# Patient Record
Sex: Male | Born: 1995 | Race: Black or African American | Hispanic: No | Marital: Single | Smoking: Never smoker
Health system: Southern US, Community
[De-identification: ages and names within clinical notes are randomized; demographics above are authoritative.]

## PROBLEM LIST (undated history)

## (undated) HISTORY — PX: ABDOMINAL SURGERY: SHX537

---

## 2020-05-25 ENCOUNTER — Emergency Department (HOSPITAL_COMMUNITY)
Admission: EM | Admit: 2020-05-25 | Discharge: 2020-05-25 | Disposition: A | Discharge status: Home / Self Care | Payer: Worker's Compensation | Attending: Emergency Medicine | Admitting: Emergency Medicine

## 2020-05-25 ENCOUNTER — Emergency Department (HOSPITAL_COMMUNITY): Payer: Worker's Compensation

## 2020-05-25 ENCOUNTER — Other Ambulatory Visit: Payer: Self-pay

## 2020-05-25 ENCOUNTER — Encounter (HOSPITAL_COMMUNITY): Payer: Self-pay | Admitting: Emergency Medicine

## 2020-05-25 DIAGNOSIS — Y990 Civilian activity done for income or pay: Secondary | ICD-10-CM | POA: Diagnosis not present

## 2020-05-25 DIAGNOSIS — W260XXA Contact with knife, initial encounter: Secondary | ICD-10-CM | POA: Insufficient documentation

## 2020-05-25 DIAGNOSIS — S61012A Laceration without foreign body of left thumb without damage to nail, initial encounter: Secondary | ICD-10-CM

## 2020-05-25 NOTE — Discharge Instructions (Addendum)
Keep area clean by washing with soap and water daily.  Apply a bandage at least once daily, change more often if it is dirty Watch for signs of infection (redness, drainage, worsening pain) and return to the ER if you have concerns for this  Take Tylenol or Ibuprofen for pain as needed

## 2020-05-25 NOTE — ED Provider Notes (Signed)
[facility]-EMERGENCY DEPT Provider Note   CSN: [id number] Arrival date & time: 05/25/20  1943     History Chief Complaint  Patient presents with  . Laceration    Matthew Dixon is a 24 y.o. male.  HPI 24 year old male with no significant medical history presents to the ER with a laceration to his left thumb.  States he cut his finger with a knife while at work.  States that he had quite a bit of bleeding but by the time he had arrived to the ER bleeding was controlled.  Denies any numbness or tingling.  Unaware of last tetanus shot.    History reviewed. No pertinent past medical history.  There are no problems to display for this patient.   Past Surgical History:  Procedure Laterality Date  . ABDOMINAL SURGERY         History reviewed. No pertinent family history.  Social History   Tobacco Use  . Smoking status: Never Smoker  . Smokeless tobacco: Never Used  Substance Use Topics  . Alcohol use: Not on file  . Drug use: Not on file    Home Medications Prior to Admission medications   Not on File    Allergies    Patient has no known allergies.  Review of Systems   Review of Systems  Skin: Positive for color change and wound.  Neurological: Negative for weakness and numbness.    Physical Exam Updated Vital Signs BP (!) 141/84 (BP Location: Right Arm)   Pulse 69   Temp 98 F (36.7 C) (Oral)   Resp 14   Ht 5\' 10"  (1.778 m)   Wt 86.2 kg   SpO2 100%   BMI 27.26 kg/m   Physical Exam Vitals reviewed.  Constitutional:      General: He is not in acute distress.    Appearance: Normal appearance. He is not ill-appearing, toxic-appearing or diaphoretic.  HENT:     Head: Normocephalic and atraumatic.  Eyes:     General:        Right eye: No discharge.        Left eye: No discharge.     Extraocular Movements: Extraocular movements intact.     Conjunctiva/sclera: Conjunctivae normal.  Musculoskeletal:        General: No  swelling. Normal range of motion.  Skin:    Capillary Refill: Capillary refill takes less than 2 seconds.     Coloration: Skin is not jaundiced.     Findings: Erythema and lesion present. No bruising.     Comments: Small superficial laceration to the lateral aspect of the left thumb near the fingernail bed.  However no visible nailbed injury noted.  Bleeding controlled.<2 cap refill.  Good sensations and strength intact.  Full range of motion of DIP and PIP joint.  No visible foreign bodies or tendons.  Neurological:     General: No focal deficit present.     Mental Status: He is alert and oriented to person, place, and time.     Sensory: No sensory deficit.     Motor: No weakness.  Psychiatric:        Mood and Affect: Mood normal.        Behavior: Behavior normal.     ED Results / Procedures / Treatments   Labs (all labs ordered are listed, but only abnormal results are displayed) Labs Reviewed - No data to display  EKG None  Radiology No results found.  Procedures Procedures (including critical care  time)  Medications Ordered in ED Medications - No data to display  ED Course  I have reviewed the triage vital signs and the nursing notes.  Pertinent labs & imaging results that were available during my care of the patient were reviewed by me and considered in my medical decision making (see chart for details).    MDM Rules/Calculators/A&P                         24 year old male with superficial laceration to the left thumb.  Bleeding is controlled.  Laceration not deep enough for suture repair.  No visible nailbed damage.  Good cap refill, strength and sensation intact.  Plain films not evidence of foreign bodies or fractures.  I offered the patient a tetanus shot, however he refused this.  I did explain the risks of not updating the tetanus and he voiced understanding of the risks.  He denies the shot.  Educated on proper wound care.  Discussed return precautions which  included worsening pain, redness, swelling, fevers, decreased range of motion etc.  He voiced understanding and is agreeable.  At this stage in the ED course, the patient is medically screened and stable for discharge. Final Clinical Impression(s) / ED Diagnoses Final diagnoses:  Laceration of left thumb without foreign body without damage to nail, initial encounter    Rx / DC Orders ED Discharge Orders    None       Leone Brand 05/25/20 2226    Sabas Sous, MD 05/25/20 [phone]

## 2020-05-25 NOTE — ED Triage Notes (Signed)
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 He has managed to control the bleeding.

## 2021-04-30 IMAGING — CR DG FINGER THUMB 2+V*L*
3 series · 3 of 3 positions shown · non-contrast
Comparison: None.

CLINICAL DATA: Laceration

EXAM:
LEFT THUMB 2+V

[x finger pa left]
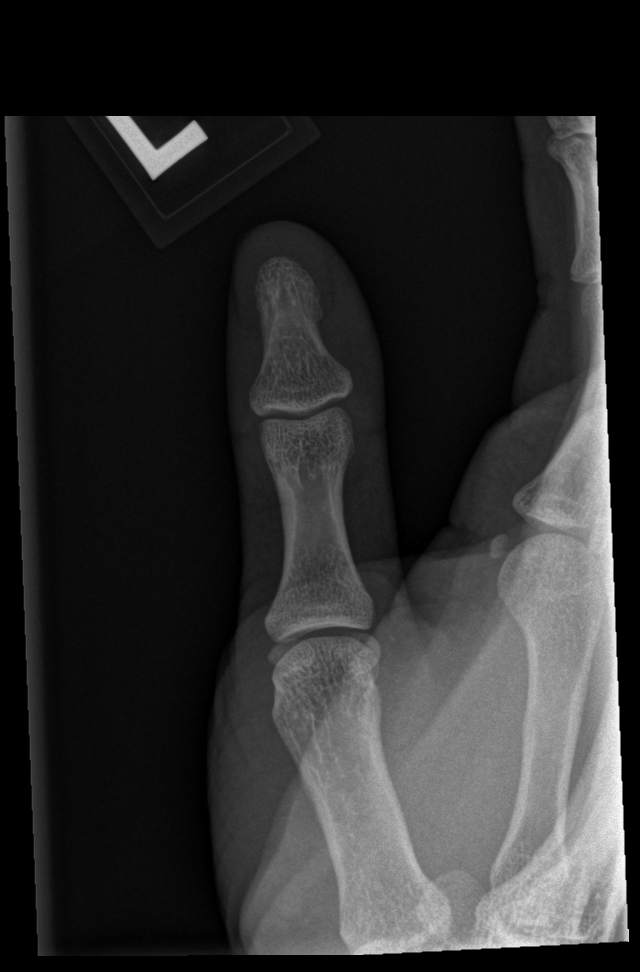

[x finger obl left]
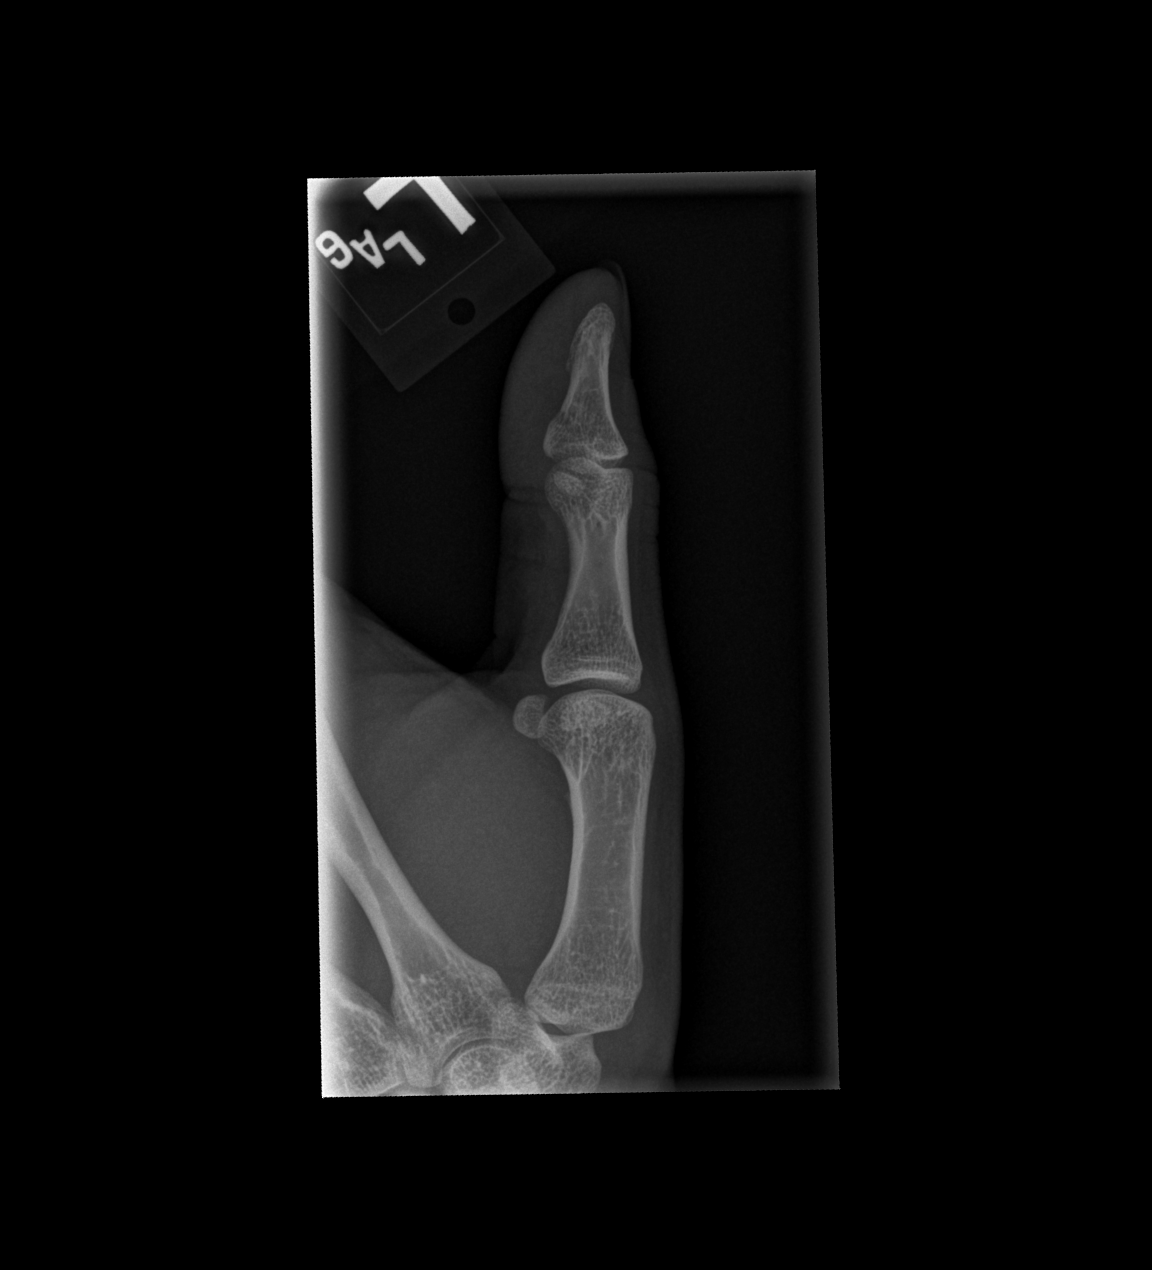

[x finger lat left]
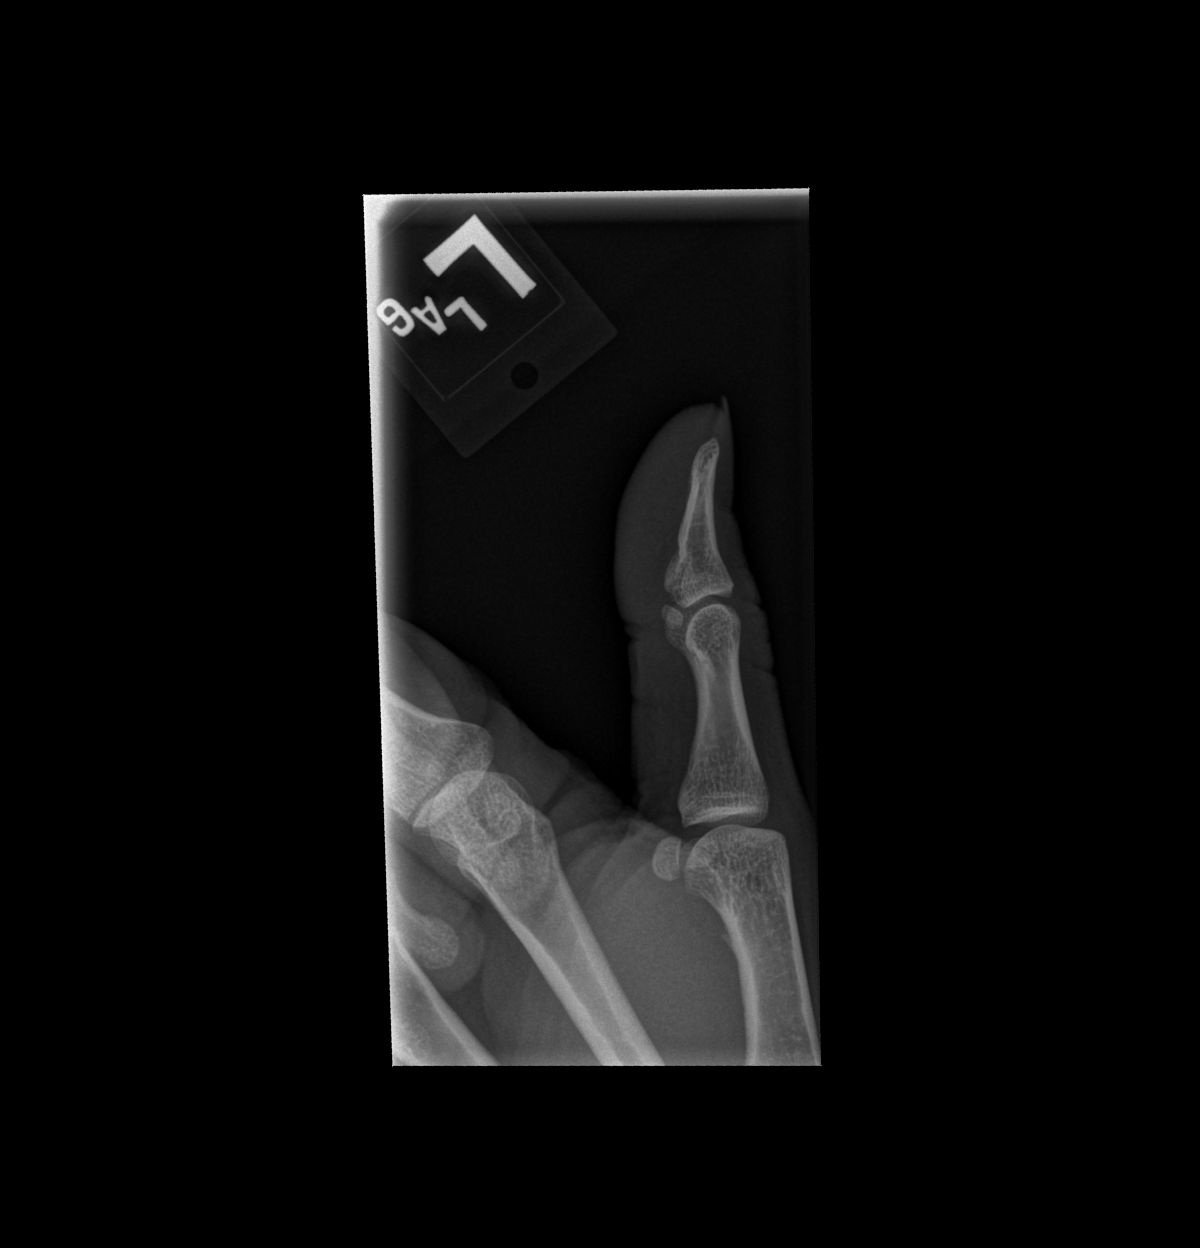

[3 of 3 positions shown; findings below may reference images not displayed]

FINDINGS: There is no evidence of fracture or dislocation. There is no
evidence of arthropathy or other focal bone abnormality. Soft
tissues are unremarkable.
IMPRESSION: Negative.

## 2024-05-19 ENCOUNTER — Emergency Department (HOSPITAL_COMMUNITY)
Admission: EM | Admit: 2024-05-19 | Discharge: 2024-05-19 | Payer: Self-pay | Attending: Emergency Medicine | Admitting: Emergency Medicine

## 2024-05-19 ENCOUNTER — Encounter (HOSPITAL_COMMUNITY): Payer: Self-pay | Admitting: Emergency Medicine

## 2024-05-19 ENCOUNTER — Other Ambulatory Visit: Payer: Self-pay

## 2024-05-19 DIAGNOSIS — Y9339 Activity, other involving climbing, rappelling and jumping off: Secondary | ICD-10-CM | POA: Insufficient documentation

## 2024-05-19 DIAGNOSIS — Z23 Encounter for immunization: Secondary | ICD-10-CM | POA: Insufficient documentation

## 2024-05-19 DIAGNOSIS — S31119A Laceration without foreign body of abdominal wall, unspecified quadrant without penetration into peritoneal cavity, initial encounter: Secondary | ICD-10-CM | POA: Insufficient documentation

## 2024-05-19 DIAGNOSIS — W25XXXA Contact with sharp glass, initial encounter: Secondary | ICD-10-CM | POA: Insufficient documentation

## 2024-05-19 MED ORDER — CEPHALEXIN 250 MG PO CAPS
500.0000 mg | ORAL_CAPSULE | Freq: Once | ORAL | Status: AC
  Administered 2024-05-19: 500 mg via ORAL
  Filled 2024-05-19: qty 2

## 2024-05-19 MED ORDER — TETANUS-DIPHTH-ACELL PERTUSSIS 5-2-15.5 LF-MCG/0.5 IM SUSP
0.5000 mL | Freq: Once | INTRAMUSCULAR | Status: AC
  Administered 2024-05-19: 0.5 mL via INTRAMUSCULAR
  Filled 2024-05-19: qty 0.5

## 2024-05-19 MED ORDER — CEPHALEXIN 500 MG PO CAPS: 500.0000 mg | ORAL_CAPSULE | Freq: Four times a day (QID) | ORAL | 0 refills | Status: AC

## 2024-05-19 NOTE — ED Triage Notes (Addendum)
 Patient escorted in by [employer] under arrest however denied by jail staff as he had a laceration to the stomach after climbing through a broken window this evening. Small laceration on the R side of abdominal. Not bleeding at this time. Not UTD on Tetanus.

## 2024-05-19 NOTE — Discharge Instructions (Addendum)
 Evaluation revealed that you had a small superficial laceration in your right mid abdomen.  Please clean daily with soap and water.  I also started you on Keflex to prevent infection.  Please follow-up with your PCP if there are signs of infection would recommend that you return to the ED for further evaluation.

## 2024-05-19 NOTE — ED Provider Notes (Signed)
  [place] EMERGENCY DEPARTMENT AT [facility] Provider Note   CSN: [id number] Arrival date & time: 05/19/24  9393     Patient presents with: Laceration  HPI Jacson Rapaport is a 28 y.o. male presenting for abdominal laceration.  He states that around 230 this morning he scratched his stomach while climbing through a broken window.  Has a small abrasion to the right mid abdomen.  He said there was a tiny bit of bleeding.  He denies stab wound.  Unsure of last tetanus shot.  He denies any pain at this time.  Denies fall, head injury or loss of consciousness.  Brought in by [employer].    Laceration      Prior to Admission medications   Medication Sig Start Date End Date Taking? Authorizing Provider  cephALEXin (KEFLEX) 500 MG capsule Take 1 capsule (500 mg total) by mouth 4 (four) times daily. 05/19/24  Yes Jaqlyn Gruenhagen K, PA-C    Allergies: Patient has no known allergies.    Review of Systems See HPI  Updated Vital Signs BP 126/75 (BP Location: Right Arm)   Pulse (!) 117   Temp 98.2 F (36.8 C)   Resp 14   Ht 5' 10 (1.778 m)   Wt 86 kg   SpO2 100%   BMI 27.20 kg/m   Physical Exam Constitutional:      Appearance: Normal appearance.  HENT:     Head: Normocephalic.     Nose: Nose normal.  Eyes:     Conjunctiva/sclera: Conjunctivae normal.  Pulmonary:     Effort: Pulmonary effort is normal.  Abdominal:   Neurological:     Mental Status: He is alert.  Psychiatric:        Mood and Affect: Mood normal.     (all labs ordered are listed, but only abnormal results are displayed) Labs Reviewed - No data to display  EKG: None  Radiology: No results found.   Procedures   Medications Ordered in the ED  cephALEXin (KEFLEX) capsule 500 mg (has no administration in time range)  Tdap (ADACEL) injection 0.5 mL (0.5 mLs Intramuscular Given 05/19/24 0803)                                    Medical Decision Making Risk Prescription drug  management.   28 year old well-appearing male presenting for laceration to the abdomen.  Exam revealing a  superficial small 1 cm laceration to the right middle abdomen.  Not actively oozing or bleeding.  Able to irrigate and clean the wound and was able to inspect it through its entirety. Depth is approximately 3 mm.  Closed with Dermabond.  Started him on Keflex.  Discussed wound care at home.  Advised him to follow-up with his PCP discussed return precautions.  Discharged to [employer].      Final diagnoses:  Laceration of abdomen, initial encounter    ED Discharge Orders          Ordered    cephALEXin (KEFLEX) 500 MG capsule  4 times daily        05/19/24 0814               Lang Norleen POUR, PA-C 05/19/24 0820    Cottie Donnice PARAS, MD 05/19/24 [phone]
